# Patient Record
Sex: Female | Born: 1950 | Race: White | Hispanic: No | Marital: Married | Smoking: Never smoker
Health system: Southern US, Community
[De-identification: ages and names within clinical notes are randomized; demographics above are authoritative.]

## PROBLEM LIST (undated history)

## (undated) DIAGNOSIS — T7840XA Allergy, unspecified, initial encounter: Secondary | ICD-10-CM

## (undated) HISTORY — DX: Allergy, unspecified, initial encounter: T78.40XA

---

## 1997-12-21 ENCOUNTER — Other Ambulatory Visit: Admission: RE | Admit: 1997-12-21 | Discharge: 1997-12-21 | Payer: Self-pay | Admitting: Obstetrics and Gynecology

## 1999-03-01 ENCOUNTER — Other Ambulatory Visit: Admission: RE | Admit: 1999-03-01 | Discharge: 1999-03-01 | Payer: Self-pay | Admitting: Obstetrics and Gynecology

## 2000-02-26 ENCOUNTER — Other Ambulatory Visit: Admission: RE | Admit: 2000-02-26 | Discharge: 2000-02-26 | Payer: Self-pay | Admitting: Obstetrics and Gynecology

## 2003-01-03 ENCOUNTER — Other Ambulatory Visit: Admission: RE | Admit: 2003-01-03 | Discharge: 2003-01-03 | Payer: Self-pay | Admitting: Obstetrics and Gynecology

## 2008-05-16 ENCOUNTER — Ambulatory Visit: Payer: Self-pay | Admitting: Internal Medicine

## 2012-06-30 ENCOUNTER — Other Ambulatory Visit: Payer: Self-pay | Admitting: Obstetrics and Gynecology

## 2012-06-30 DIAGNOSIS — R928 Other abnormal and inconclusive findings on diagnostic imaging of breast: Secondary | ICD-10-CM

## 2012-07-14 ENCOUNTER — Ambulatory Visit
Admission: RE | Admit: 2012-07-14 | Discharge: 2012-07-14 | Disposition: A | Discharge status: Home / Self Care | Payer: BC Managed Care – PPO | Source: Ambulatory Visit | Attending: Obstetrics and Gynecology | Admitting: Obstetrics and Gynecology

## 2012-07-14 DIAGNOSIS — R928 Other abnormal and inconclusive findings on diagnostic imaging of breast: Secondary | ICD-10-CM

## 2013-01-19 ENCOUNTER — Other Ambulatory Visit: Payer: Self-pay | Admitting: Obstetrics and Gynecology

## 2013-01-19 DIAGNOSIS — R9230 Dense breasts, unspecified: Secondary | ICD-10-CM

## 2013-01-19 DIAGNOSIS — R922 Inconclusive mammogram: Secondary | ICD-10-CM

## 2013-01-29 ENCOUNTER — Ambulatory Visit
Admission: RE | Admit: 2013-01-29 | Discharge: 2013-01-29 | Disposition: A | Discharge status: Home / Self Care | Payer: BC Managed Care – PPO | Source: Ambulatory Visit | Attending: Obstetrics and Gynecology | Admitting: Obstetrics and Gynecology

## 2013-01-29 DIAGNOSIS — R922 Inconclusive mammogram: Secondary | ICD-10-CM

## 2014-04-08 ENCOUNTER — Other Ambulatory Visit: Payer: Self-pay | Admitting: Obstetrics and Gynecology

## 2014-04-08 ENCOUNTER — Other Ambulatory Visit: Payer: Self-pay

## 2014-04-08 DIAGNOSIS — Z1231 Encounter for screening mammogram for malignant neoplasm of breast: Secondary | ICD-10-CM

## 2014-05-16 ENCOUNTER — Ambulatory Visit: Payer: BC Managed Care – PPO

## 2014-05-17 ENCOUNTER — Ambulatory Visit (HOSPITAL_COMMUNITY): Payer: BC Managed Care – PPO

## 2014-05-25 ENCOUNTER — Ambulatory Visit
Admission: RE | Admit: 2014-05-25 | Discharge: 2014-05-25 | Disposition: A | Discharge status: Home / Self Care | Payer: BC Managed Care – PPO | Source: Ambulatory Visit

## 2014-05-25 DIAGNOSIS — Z1231 Encounter for screening mammogram for malignant neoplasm of breast: Secondary | ICD-10-CM

## 2015-06-13 ENCOUNTER — Ambulatory Visit (INDEPENDENT_AMBULATORY_CARE_PROVIDER_SITE_OTHER): Payer: BLUE CROSS/BLUE SHIELD | Admitting: Internal Medicine

## 2015-06-13 VITALS — BP 128/76 | HR 100 | Temp 97.9°F | Resp 16 | Ht 64.0 in | Wt 143.0 lb

## 2015-06-13 DIAGNOSIS — R509 Fever, unspecified: Secondary | ICD-10-CM | POA: Diagnosis not present

## 2015-06-13 DIAGNOSIS — J029 Acute pharyngitis, unspecified: Secondary | ICD-10-CM

## 2015-06-13 LAB — POCT RAPID STREP A (OFFICE): Rapid Strep A Screen: NEGATIVE

## 2015-06-13 MED ORDER — HYDROCODONE-ACETAMINOPHEN 7.5-325 MG/15ML PO SOLN: 5.0000 mL | Freq: Four times a day (QID) | ORAL | Status: AC | PRN

## 2015-06-13 MED ORDER — AZITHROMYCIN 500 MG PO TABS: 500.0000 mg | ORAL_TABLET | Freq: Every day | ORAL | Status: AC

## 2015-06-13 NOTE — Patient Instructions (Signed)

## 2015-06-13 NOTE — Progress Notes (Signed)
Patient ID: Maria Harris, female   DOB: 04-01-1951, 64 y.o.   MRN: [id number]   06/13/2015 at 1:20 PM  Maria Harris / DOB: [birth year] / MRN: [id number]  Problem list reviewed and updated by me where necessary.   SUBJECTIVE  Maria Harris is a 64 y.o. well appearing female presenting for the chief complaint of very sore throst, ear pain, fever for 1 day. No chest sxs.On no meds..     She  has a past medical history of Allergy.    Medications reviewed and updated by myself where necessary, and exist elsewhere in the encounter.   Ms. Hisle is allergic to sulfonamide derivatives. She  reports that she has never smoked. She has never used smokeless tobacco. She reports that she drinks about 1.8 oz of alcohol per week. She reports that she does not use illicit drugs. She  has no sexual activity history on file. The patient  has past surgical history that includes Cesarean section.  Her family history includes Cancer in her paternal grandfather; Heart disease in her father; Stroke in her paternal grandfather.  Review of Systems  Constitutional: Negative for fever.  Respiratory: Negative for shortness of breath.   Cardiovascular: Negative for chest pain.  Gastrointestinal: Negative for nausea.  Skin: Negative for rash.  Neurological: Negative for dizziness and headaches.    OBJECTIVE  Her  height is 5\' 4"  (1.626 m) and weight is 143 lb (64.864 kg). Her oral temperature is 97.9 F (36.6 C). Her blood pressure is 128/76 and her pulse is 100. Her respiration is 16 and oxygen saturation is 98%.  The patient's body mass index is 24.53 kg/(m^2).  Physical Exam  Constitutional: She is oriented to person, place, and time. She appears well-developed and well-nourished.  HENT:  Head: Normocephalic.  Right Ear: External ear normal.  Left Ear: External ear normal.  Nose: Nose normal.  Mouth/Throat: Uvula is midline. Uvula swelling present. Posterior oropharyngeal edema and  posterior oropharyngeal erythema present.  Eyes: Conjunctivae are normal. No scleral icterus.  Neck: Normal range of motion.  Cardiovascular: Normal rate.   Respiratory: Effort normal.  GI: She exhibits no distension.  Musculoskeletal: Normal range of motion.  Neurological: She is alert and oriented to person, place, and time. Coordination normal.  Skin: Skin is warm and dry.  Psychiatric: She has a normal mood and affect.    Results for orders placed or performed in visit on 06/13/15 (from the past 24 hour(s))  POCT rapid strep A     Status: Normal   Collection Time: 06/13/15 12:22 PM  Result Value Ref Range   Rapid Strep A Screen Negative Negative    ASSESSMENT & PLAN  Jase was seen today for sore throat and gi problem.  Diagnoses and all orders for this visit:  Acute pharyngitis, unspecified etiology -     POCT rapid strep A  Fever, unspecified

## 2015-10-02 ENCOUNTER — Other Ambulatory Visit: Payer: Self-pay

## 2015-10-02 DIAGNOSIS — Z1231 Encounter for screening mammogram for malignant neoplasm of breast: Secondary | ICD-10-CM

## 2015-10-04 ENCOUNTER — Ambulatory Visit
Admission: RE | Admit: 2015-10-04 | Discharge: 2015-10-04 | Disposition: A | Discharge status: Home / Self Care | Payer: BLUE CROSS/BLUE SHIELD | Source: Ambulatory Visit

## 2015-10-04 DIAGNOSIS — Z1231 Encounter for screening mammogram for malignant neoplasm of breast: Secondary | ICD-10-CM

## 2017-03-26 ENCOUNTER — Other Ambulatory Visit: Payer: Self-pay | Admitting: Obstetrics and Gynecology

## 2017-03-26 DIAGNOSIS — Z1231 Encounter for screening mammogram for malignant neoplasm of breast: Secondary | ICD-10-CM

## 2017-04-04 ENCOUNTER — Ambulatory Visit
Admission: RE | Admit: 2017-04-04 | Discharge: 2017-04-04 | Disposition: A | Discharge status: Home / Self Care | Payer: BLUE CROSS/BLUE SHIELD | Source: Ambulatory Visit | Attending: Obstetrics and Gynecology | Admitting: Obstetrics and Gynecology

## 2017-04-04 DIAGNOSIS — Z1231 Encounter for screening mammogram for malignant neoplasm of breast: Secondary | ICD-10-CM

## 2017-06-24 DIAGNOSIS — R51 Headache: Secondary | ICD-10-CM | POA: Diagnosis not present

## 2017-06-24 DIAGNOSIS — Z0000 Encounter for general adult medical examination without abnormal findings: Secondary | ICD-10-CM | POA: Diagnosis not present

## 2017-08-20 DIAGNOSIS — R69 Illness, unspecified: Secondary | ICD-10-CM | POA: Diagnosis not present

## 2017-10-13 DIAGNOSIS — K635 Polyp of colon: Secondary | ICD-10-CM | POA: Diagnosis not present

## 2017-10-13 DIAGNOSIS — Z1211 Encounter for screening for malignant neoplasm of colon: Secondary | ICD-10-CM | POA: Diagnosis not present

## 2017-10-13 DIAGNOSIS — K5730 Diverticulosis of large intestine without perforation or abscess without bleeding: Secondary | ICD-10-CM | POA: Diagnosis not present

## 2017-10-13 DIAGNOSIS — K648 Other hemorrhoids: Secondary | ICD-10-CM | POA: Diagnosis not present

## 2017-10-13 DIAGNOSIS — D126 Benign neoplasm of colon, unspecified: Secondary | ICD-10-CM | POA: Diagnosis not present

## 2017-10-15 DIAGNOSIS — K635 Polyp of colon: Secondary | ICD-10-CM | POA: Diagnosis not present

## 2017-10-15 DIAGNOSIS — Z1211 Encounter for screening for malignant neoplasm of colon: Secondary | ICD-10-CM | POA: Diagnosis not present

## 2017-10-15 DIAGNOSIS — D126 Benign neoplasm of colon, unspecified: Secondary | ICD-10-CM | POA: Diagnosis not present

## 2017-12-16 DIAGNOSIS — Z13220 Encounter for screening for lipoid disorders: Secondary | ICD-10-CM | POA: Diagnosis not present

## 2017-12-16 DIAGNOSIS — G4489 Other headache syndrome: Secondary | ICD-10-CM | POA: Diagnosis not present

## 2017-12-16 DIAGNOSIS — Z23 Encounter for immunization: Secondary | ICD-10-CM | POA: Diagnosis not present

## 2017-12-16 DIAGNOSIS — Z0000 Encounter for general adult medical examination without abnormal findings: Secondary | ICD-10-CM | POA: Diagnosis not present

## 2017-12-16 DIAGNOSIS — Z79899 Other long term (current) drug therapy: Secondary | ICD-10-CM | POA: Diagnosis not present

## 2018-03-03 DIAGNOSIS — R69 Illness, unspecified: Secondary | ICD-10-CM | POA: Diagnosis not present

## 2018-05-06 DIAGNOSIS — Z01419 Encounter for gynecological examination (general) (routine) without abnormal findings: Secondary | ICD-10-CM | POA: Diagnosis not present

## 2018-05-06 DIAGNOSIS — Z6825 Body mass index (BMI) 25.0-25.9, adult: Secondary | ICD-10-CM | POA: Diagnosis not present

## 2018-05-06 DIAGNOSIS — M859 Disorder of bone density and structure, unspecified: Secondary | ICD-10-CM | POA: Diagnosis not present

## 2018-05-06 DIAGNOSIS — N952 Postmenopausal atrophic vaginitis: Secondary | ICD-10-CM | POA: Diagnosis not present

## 2018-05-22 DIAGNOSIS — R69 Illness, unspecified: Secondary | ICD-10-CM | POA: Diagnosis not present

## 2018-09-09 DIAGNOSIS — R69 Illness, unspecified: Secondary | ICD-10-CM | POA: Diagnosis not present

## 2018-10-01 ENCOUNTER — Other Ambulatory Visit: Payer: Self-pay | Admitting: Obstetrics and Gynecology

## 2018-10-01 DIAGNOSIS — Z1231 Encounter for screening mammogram for malignant neoplasm of breast: Secondary | ICD-10-CM

## 2018-10-02 ENCOUNTER — Ambulatory Visit
Admission: RE | Admit: 2018-10-02 | Discharge: 2018-10-02 | Disposition: A | Discharge status: Home / Self Care | Payer: Medicare HMO | Source: Ambulatory Visit | Attending: Obstetrics and Gynecology | Admitting: Obstetrics and Gynecology

## 2018-10-02 DIAGNOSIS — Z1231 Encounter for screening mammogram for malignant neoplasm of breast: Secondary | ICD-10-CM

## 2018-12-24 ENCOUNTER — Other Ambulatory Visit: Payer: Self-pay

## 2018-12-24 ENCOUNTER — Other Ambulatory Visit: Payer: Self-pay | Admitting: Geriatric Medicine

## 2018-12-24 ENCOUNTER — Ambulatory Visit
Admission: RE | Admit: 2018-12-24 | Discharge: 2018-12-24 | Disposition: A | Discharge status: Home / Self Care | Payer: Medicare HMO | Source: Ambulatory Visit | Attending: Geriatric Medicine | Admitting: Geriatric Medicine

## 2018-12-24 DIAGNOSIS — M25551 Pain in right hip: Secondary | ICD-10-CM

## 2018-12-24 DIAGNOSIS — Z23 Encounter for immunization: Secondary | ICD-10-CM | POA: Diagnosis not present

## 2018-12-24 DIAGNOSIS — Z79899 Other long term (current) drug therapy: Secondary | ICD-10-CM | POA: Diagnosis not present

## 2018-12-24 DIAGNOSIS — Z0000 Encounter for general adult medical examination without abnormal findings: Secondary | ICD-10-CM | POA: Diagnosis not present

## 2018-12-24 DIAGNOSIS — Z1389 Encounter for screening for other disorder: Secondary | ICD-10-CM | POA: Diagnosis not present

## 2019-03-25 DIAGNOSIS — R69 Illness, unspecified: Secondary | ICD-10-CM | POA: Diagnosis not present

## 2019-05-19 DIAGNOSIS — R69 Illness, unspecified: Secondary | ICD-10-CM | POA: Diagnosis not present

## 2019-05-31 DIAGNOSIS — H524 Presbyopia: Secondary | ICD-10-CM | POA: Diagnosis not present

## 2019-05-31 DIAGNOSIS — H2513 Age-related nuclear cataract, bilateral: Secondary | ICD-10-CM | POA: Diagnosis not present

## 2019-05-31 DIAGNOSIS — H5203 Hypermetropia, bilateral: Secondary | ICD-10-CM | POA: Diagnosis not present

## 2019-09-14 ENCOUNTER — Ambulatory Visit: Payer: Medicare HMO

## 2019-10-05 ENCOUNTER — Ambulatory Visit: Payer: Medicare HMO

## 2019-10-12 ENCOUNTER — Other Ambulatory Visit: Payer: Self-pay | Admitting: Obstetrics and Gynecology

## 2019-10-12 DIAGNOSIS — Z1231 Encounter for screening mammogram for malignant neoplasm of breast: Secondary | ICD-10-CM

## 2019-11-16 ENCOUNTER — Ambulatory Visit
Admission: RE | Admit: 2019-11-16 | Discharge: 2019-11-16 | Disposition: A | Discharge status: Home / Self Care | Payer: Medicare HMO | Source: Ambulatory Visit | Attending: Obstetrics and Gynecology | Admitting: Obstetrics and Gynecology

## 2019-11-16 ENCOUNTER — Other Ambulatory Visit: Payer: Self-pay

## 2019-11-16 DIAGNOSIS — Z1231 Encounter for screening mammogram for malignant neoplasm of breast: Secondary | ICD-10-CM

## 2019-12-27 DIAGNOSIS — Z79899 Other long term (current) drug therapy: Secondary | ICD-10-CM | POA: Diagnosis not present

## 2019-12-27 DIAGNOSIS — R300 Dysuria: Secondary | ICD-10-CM | POA: Diagnosis not present

## 2019-12-27 DIAGNOSIS — Z1389 Encounter for screening for other disorder: Secondary | ICD-10-CM | POA: Diagnosis not present

## 2019-12-27 DIAGNOSIS — Z23 Encounter for immunization: Secondary | ICD-10-CM | POA: Diagnosis not present

## 2019-12-27 DIAGNOSIS — Z0000 Encounter for general adult medical examination without abnormal findings: Secondary | ICD-10-CM | POA: Diagnosis not present

## 2020-01-18 DIAGNOSIS — L290 Pruritus ani: Secondary | ICD-10-CM | POA: Diagnosis not present

## 2020-01-18 DIAGNOSIS — N898 Other specified noninflammatory disorders of vagina: Secondary | ICD-10-CM | POA: Diagnosis not present

## 2020-01-18 DIAGNOSIS — R350 Frequency of micturition: Secondary | ICD-10-CM | POA: Diagnosis not present

## 2020-01-18 DIAGNOSIS — N952 Postmenopausal atrophic vaginitis: Secondary | ICD-10-CM | POA: Diagnosis not present

## 2020-05-02 DIAGNOSIS — R69 Illness, unspecified: Secondary | ICD-10-CM | POA: Diagnosis not present

## 2020-05-19 DIAGNOSIS — R69 Illness, unspecified: Secondary | ICD-10-CM | POA: Diagnosis not present

## 2020-05-23 DIAGNOSIS — Z6825 Body mass index (BMI) 25.0-25.9, adult: Secondary | ICD-10-CM | POA: Diagnosis not present

## 2020-05-23 DIAGNOSIS — M859 Disorder of bone density and structure, unspecified: Secondary | ICD-10-CM | POA: Diagnosis not present

## 2020-05-23 DIAGNOSIS — Z01419 Encounter for gynecological examination (general) (routine) without abnormal findings: Secondary | ICD-10-CM | POA: Diagnosis not present

## 2020-05-23 DIAGNOSIS — B009 Herpesviral infection, unspecified: Secondary | ICD-10-CM | POA: Diagnosis not present

## 2020-05-23 DIAGNOSIS — N952 Postmenopausal atrophic vaginitis: Secondary | ICD-10-CM | POA: Diagnosis not present

## 2020-05-30 ENCOUNTER — Other Ambulatory Visit: Payer: Self-pay | Admitting: Obstetrics and Gynecology

## 2020-05-30 DIAGNOSIS — M8580 Other specified disorders of bone density and structure, unspecified site: Secondary | ICD-10-CM

## 2020-07-28 DIAGNOSIS — H524 Presbyopia: Secondary | ICD-10-CM | POA: Diagnosis not present

## 2020-07-28 DIAGNOSIS — H2513 Age-related nuclear cataract, bilateral: Secondary | ICD-10-CM | POA: Diagnosis not present

## 2020-07-28 DIAGNOSIS — H5203 Hypermetropia, bilateral: Secondary | ICD-10-CM | POA: Diagnosis not present

## 2020-09-19 ENCOUNTER — Other Ambulatory Visit: Payer: Self-pay

## 2020-09-19 ENCOUNTER — Ambulatory Visit
Admission: RE | Admit: 2020-09-19 | Discharge: 2020-09-19 | Disposition: A | Discharge status: Home / Self Care | Payer: Medicare HMO | Source: Ambulatory Visit | Attending: Obstetrics and Gynecology | Admitting: Obstetrics and Gynecology

## 2020-09-19 DIAGNOSIS — M8589 Other specified disorders of bone density and structure, multiple sites: Secondary | ICD-10-CM | POA: Diagnosis not present

## 2020-09-19 DIAGNOSIS — M8580 Other specified disorders of bone density and structure, unspecified site: Secondary | ICD-10-CM

## 2020-09-19 DIAGNOSIS — Z780 Asymptomatic menopausal state: Secondary | ICD-10-CM | POA: Diagnosis not present

## 2020-12-29 DIAGNOSIS — Z136 Encounter for screening for cardiovascular disorders: Secondary | ICD-10-CM | POA: Diagnosis not present

## 2020-12-29 DIAGNOSIS — Z79899 Other long term (current) drug therapy: Secondary | ICD-10-CM | POA: Diagnosis not present

## 2020-12-29 DIAGNOSIS — N393 Stress incontinence (female) (male): Secondary | ICD-10-CM | POA: Diagnosis not present

## 2020-12-29 DIAGNOSIS — Z0000 Encounter for general adult medical examination without abnormal findings: Secondary | ICD-10-CM | POA: Diagnosis not present

## 2021-04-05 DIAGNOSIS — H43812 Vitreous degeneration, left eye: Secondary | ICD-10-CM | POA: Diagnosis not present

## 2021-05-28 DIAGNOSIS — M859 Disorder of bone density and structure, unspecified: Secondary | ICD-10-CM | POA: Diagnosis not present

## 2021-05-28 DIAGNOSIS — Z01419 Encounter for gynecological examination (general) (routine) without abnormal findings: Secondary | ICD-10-CM | POA: Diagnosis not present

## 2021-05-28 DIAGNOSIS — Z6825 Body mass index (BMI) 25.0-25.9, adult: Secondary | ICD-10-CM | POA: Diagnosis not present

## 2021-06-26 DIAGNOSIS — D2271 Melanocytic nevi of right lower limb, including hip: Secondary | ICD-10-CM | POA: Diagnosis not present

## 2021-06-26 DIAGNOSIS — I8391 Asymptomatic varicose veins of right lower extremity: Secondary | ICD-10-CM | POA: Diagnosis not present

## 2021-06-26 DIAGNOSIS — D2371 Other benign neoplasm of skin of right lower limb, including hip: Secondary | ICD-10-CM | POA: Diagnosis not present

## 2021-06-26 DIAGNOSIS — D1801 Hemangioma of skin and subcutaneous tissue: Secondary | ICD-10-CM | POA: Diagnosis not present

## 2021-06-26 DIAGNOSIS — L819 Disorder of pigmentation, unspecified: Secondary | ICD-10-CM | POA: Diagnosis not present

## 2021-06-26 DIAGNOSIS — L820 Inflamed seborrheic keratosis: Secondary | ICD-10-CM | POA: Diagnosis not present

## 2021-07-09 ENCOUNTER — Other Ambulatory Visit: Payer: Self-pay | Admitting: Obstetrics and Gynecology

## 2021-07-09 DIAGNOSIS — Z1231 Encounter for screening mammogram for malignant neoplasm of breast: Secondary | ICD-10-CM

## 2021-08-16 ENCOUNTER — Ambulatory Visit
Admission: RE | Admit: 2021-08-16 | Discharge: 2021-08-16 | Disposition: A | Discharge status: Home / Self Care | Payer: Medicare HMO | Source: Ambulatory Visit

## 2021-08-16 DIAGNOSIS — Z1231 Encounter for screening mammogram for malignant neoplasm of breast: Secondary | ICD-10-CM | POA: Diagnosis not present

## 2022-04-05 IMAGING — MG MM DIGITAL SCREENING BILAT W/ TOMO AND CAD
8 series · 9 of 24 positions shown · non-contrast
Comparison: Previous exam(s).

CLINICAL DATA: Screening.

EXAM:
DIGITAL SCREENING BILATERAL MAMMOGRAM WITH TOMOSYNTHESIS AND CAD
TECHNIQUE: Bilateral screening digital craniocaudal and mediolateral oblique
mammograms were obtained. Bilateral screening digital breast
tomosynthesis was performed. The images were evaluated with
computer-aided detection.

[R MLO synth-2D]
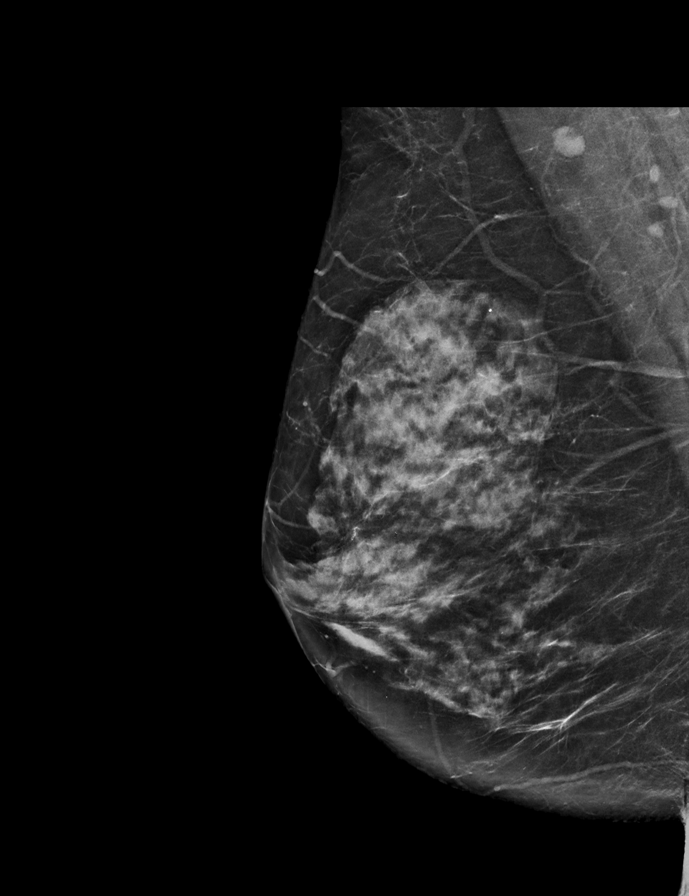

[R CC synth-2D]
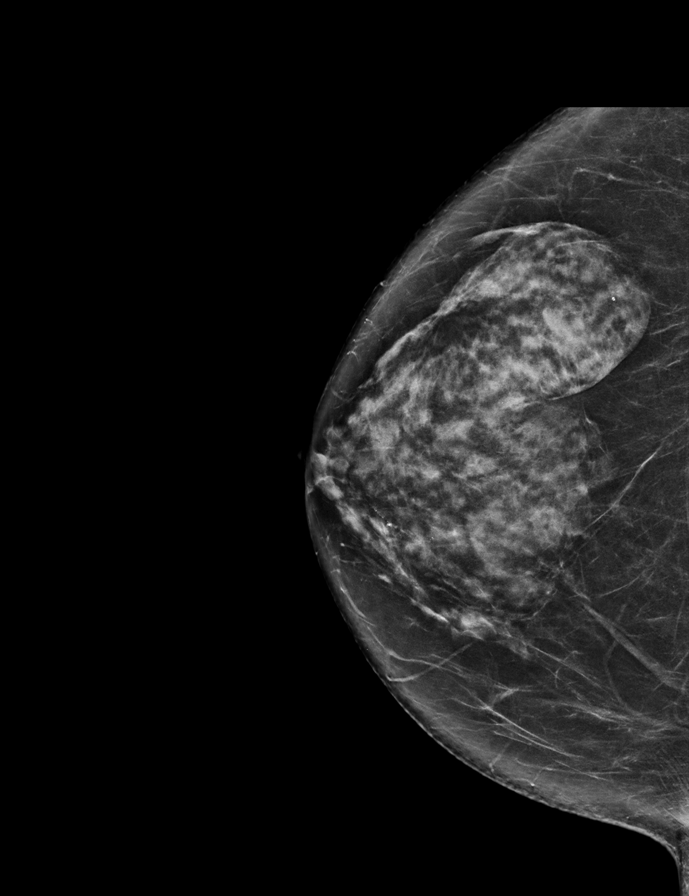

[L MLO synth-2D]
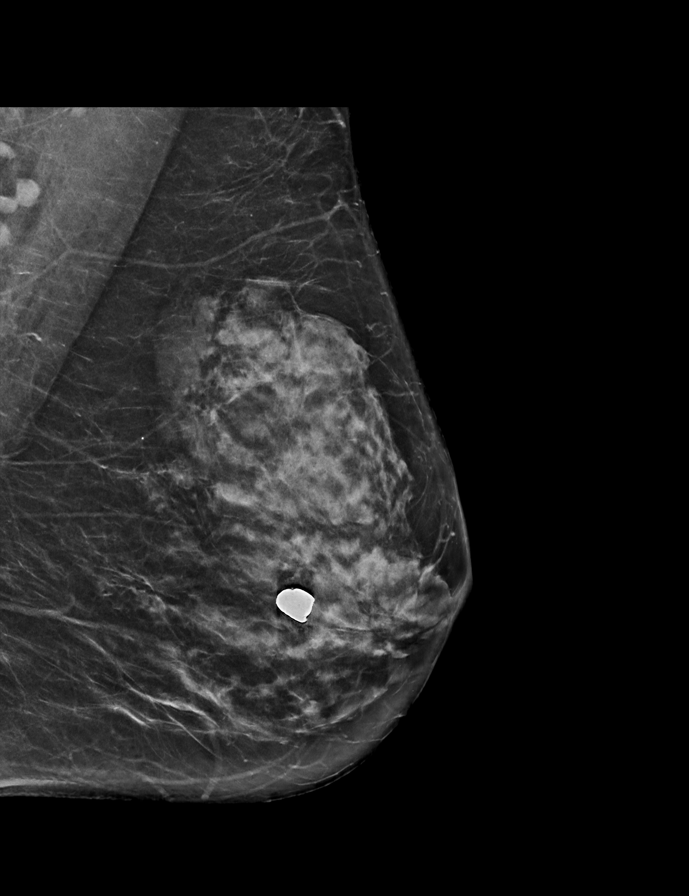

[L CC synth-2D]
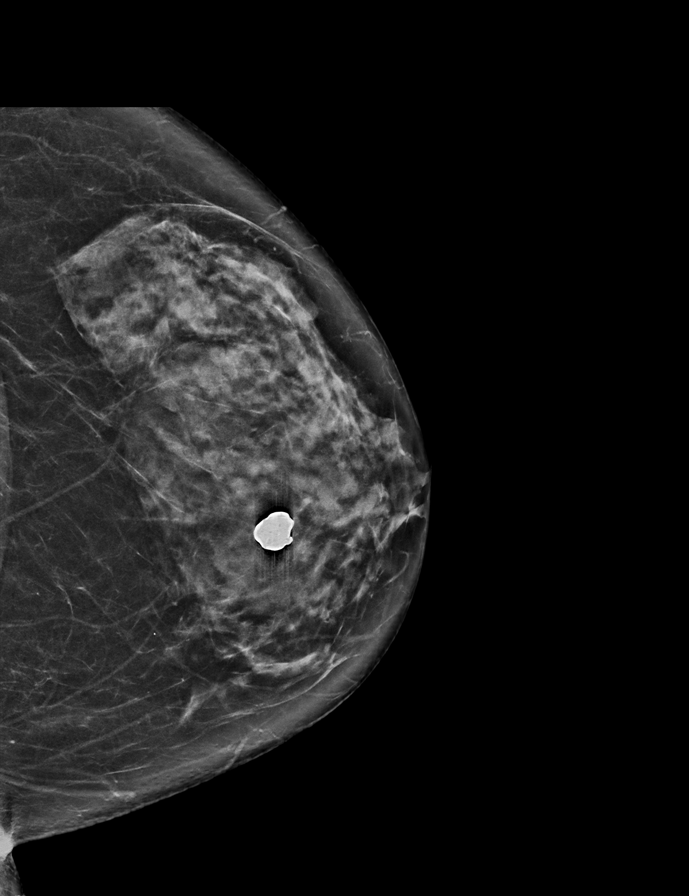

[R MLO tomo · 2 of 67 frames shown]
[frame 22/67]
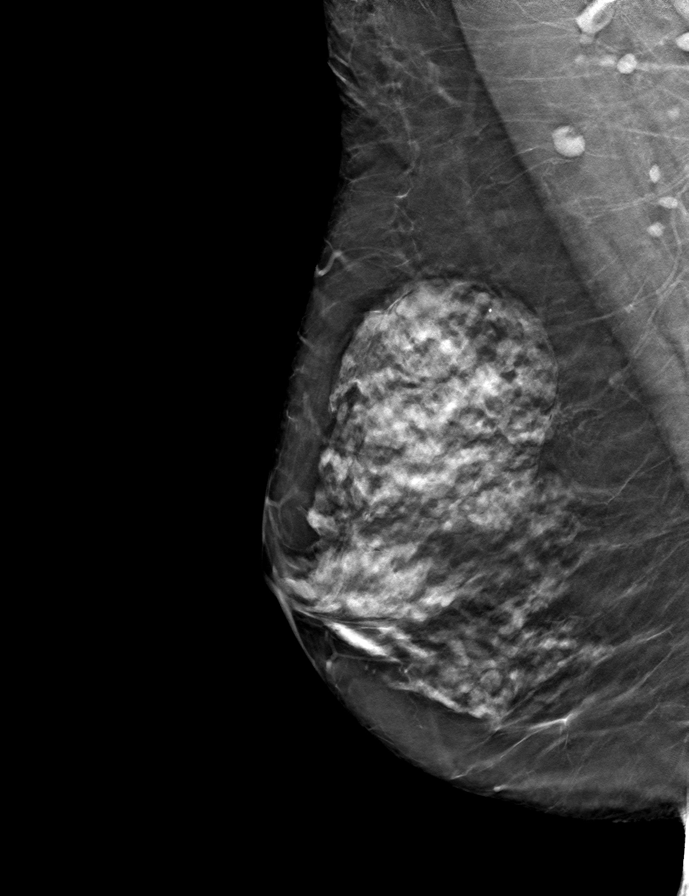
[frame 34/67]
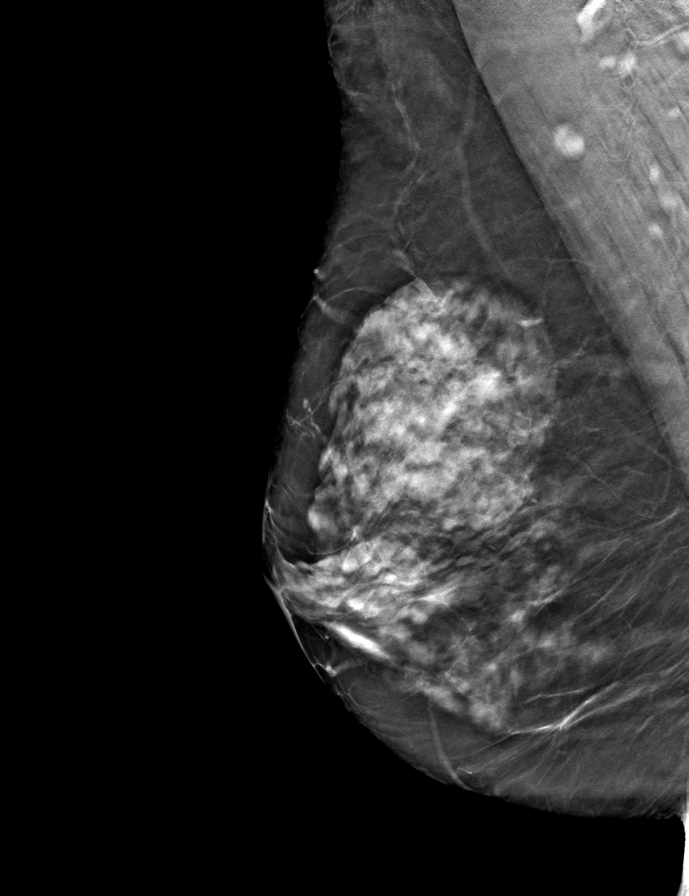

[R CC tomo · tomo slice 35/69.0]
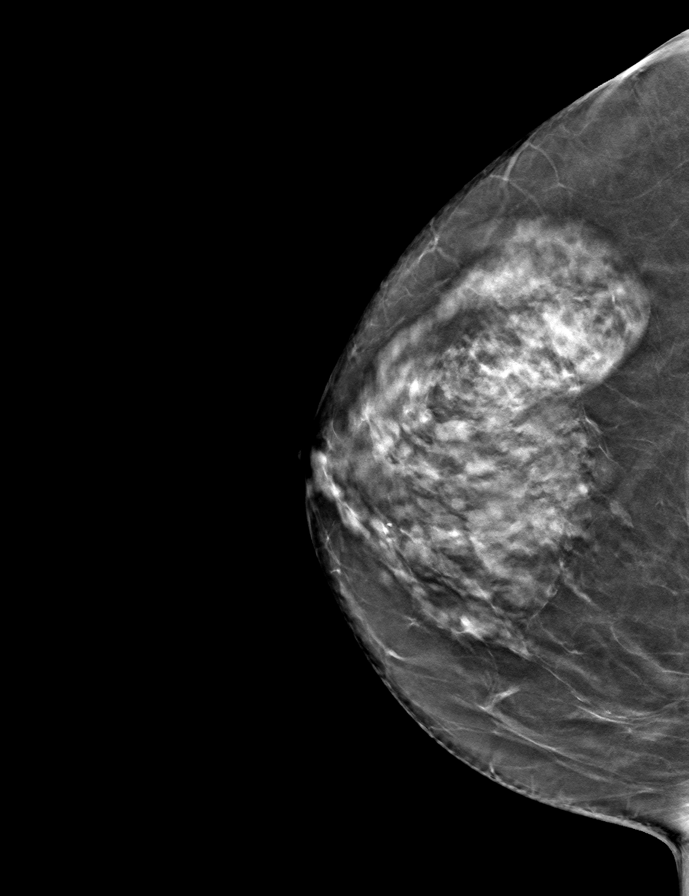

[L MLO tomo · tomo slice 34/67.0]
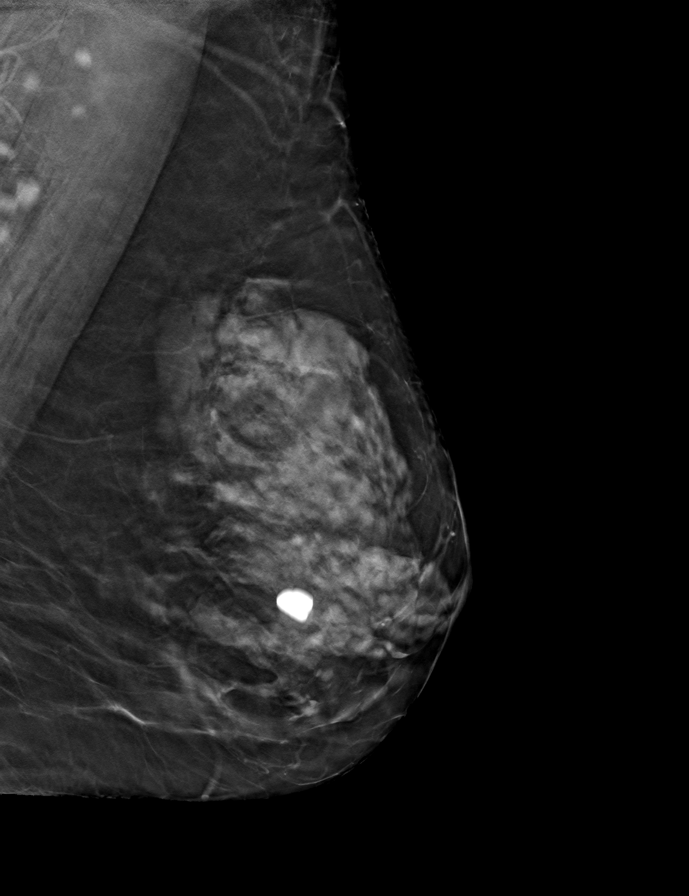

[L CC tomo · tomo slice 33/66.0]
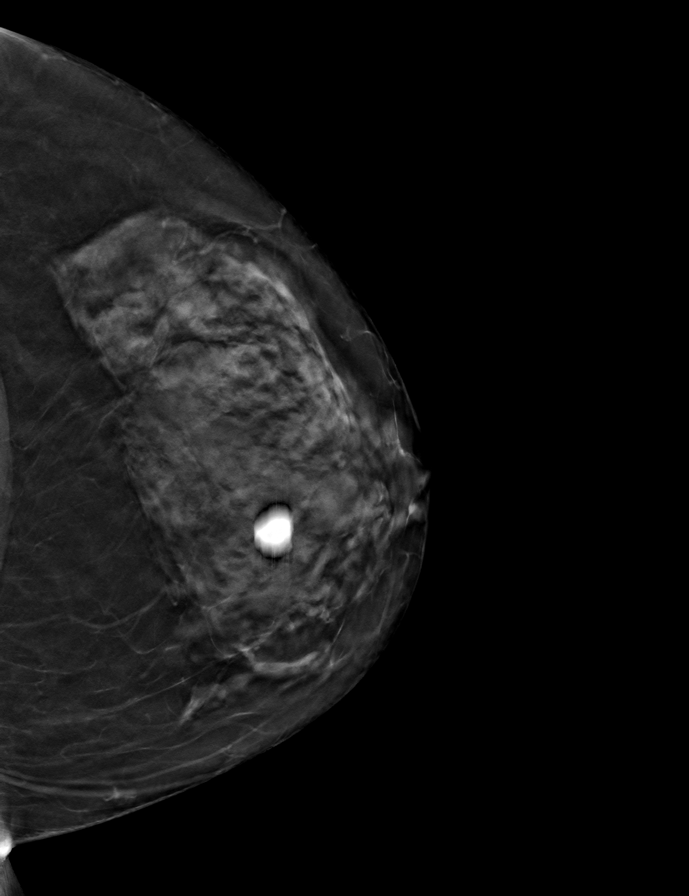

[9 of 24 positions shown; findings below may reference images not displayed]

ACR Breast Density Category c: The breast tissue is heterogeneously
dense, which may obscure small masses.
FINDINGS: There are no findings suspicious for malignancy.
IMPRESSION: No mammographic evidence of malignancy. A result letter of this
screening mammogram will be mailed directly to the patient.

RECOMMENDATION:
Screening mammogram in one year. (Code:Q3-W-BC3)

BI-RADS CATEGORY  1: Negative.

## 2022-06-13 ENCOUNTER — Other Ambulatory Visit: Payer: Self-pay | Admitting: Obstetrics and Gynecology

## 2022-06-13 DIAGNOSIS — Z1231 Encounter for screening mammogram for malignant neoplasm of breast: Secondary | ICD-10-CM

## 2022-08-21 ENCOUNTER — Ambulatory Visit
Admission: RE | Admit: 2022-08-21 | Discharge: 2022-08-21 | Disposition: A | Discharge status: Home / Self Care | Payer: Medicare Other | Source: Ambulatory Visit | Attending: Obstetrics and Gynecology | Admitting: Obstetrics and Gynecology

## 2022-08-21 DIAGNOSIS — Z1231 Encounter for screening mammogram for malignant neoplasm of breast: Secondary | ICD-10-CM

## 2023-06-05 ENCOUNTER — Other Ambulatory Visit: Payer: Self-pay | Admitting: Obstetrics and Gynecology

## 2023-06-05 DIAGNOSIS — M8580 Other specified disorders of bone density and structure, unspecified site: Secondary | ICD-10-CM

## 2023-07-25 ENCOUNTER — Other Ambulatory Visit: Payer: Self-pay | Admitting: Obstetrics and Gynecology

## 2023-07-25 DIAGNOSIS — Z1231 Encounter for screening mammogram for malignant neoplasm of breast: Secondary | ICD-10-CM

## 2023-08-22 DIAGNOSIS — Z1231 Encounter for screening mammogram for malignant neoplasm of breast: Secondary | ICD-10-CM

## 2023-08-25 ENCOUNTER — Ambulatory Visit
Admission: RE | Admit: 2023-08-25 | Discharge: 2023-08-25 | Disposition: A | Discharge status: Home / Self Care | Payer: Medicare Other | Source: Ambulatory Visit

## 2023-08-25 DIAGNOSIS — Z1231 Encounter for screening mammogram for malignant neoplasm of breast: Secondary | ICD-10-CM

## 2024-01-15 ENCOUNTER — Ambulatory Visit
Admission: RE | Admit: 2024-01-15 | Discharge: 2024-01-15 | Disposition: A | Discharge status: Home / Self Care | Payer: Medicare Other | Source: Ambulatory Visit | Attending: Obstetrics and Gynecology | Admitting: Obstetrics and Gynecology

## 2024-01-15 DIAGNOSIS — M8580 Other specified disorders of bone density and structure, unspecified site: Secondary | ICD-10-CM
